# Patient Record
Sex: Male | Born: 1980 | Race: Black or African American | Hispanic: No | Marital: Single | Smoking: Current every day smoker
Health system: Southern US, Community
[De-identification: ages and names within clinical notes are randomized; demographics above are authoritative.]

---

## 2004-07-02 ENCOUNTER — Emergency Department (HOSPITAL_COMMUNITY): Admission: EM | Admit: 2004-07-02 | Discharge: 2004-07-02 | Payer: Self-pay | Admitting: Emergency Medicine

## 2004-12-26 ENCOUNTER — Emergency Department (HOSPITAL_COMMUNITY): Admission: EM | Admit: 2004-12-26 | Discharge: 2004-12-26 | Payer: Self-pay | Admitting: Emergency Medicine

## 2009-11-10 ENCOUNTER — Emergency Department (HOSPITAL_COMMUNITY): Admission: EM | Admit: 2009-11-10 | Discharge: 2009-11-10 | Payer: Self-pay | Admitting: Emergency Medicine

## 2010-11-19 LAB — RAPID STREP SCREEN (MED CTR MEBANE ONLY): Streptococcus, Group A Screen (Direct): NEGATIVE

## 2011-03-01 ENCOUNTER — Emergency Department (HOSPITAL_COMMUNITY)
Admission: EM | Admit: 2011-03-01 | Discharge: 2011-03-01 | Disposition: A | Discharge status: Home / Self Care | Payer: Self-pay | Attending: Emergency Medicine | Admitting: Emergency Medicine

## 2011-03-01 ENCOUNTER — Emergency Department (HOSPITAL_COMMUNITY): Payer: Self-pay

## 2011-03-01 DIAGNOSIS — K047 Periapical abscess without sinus: Secondary | ICD-10-CM | POA: Insufficient documentation

## 2013-10-16 ENCOUNTER — Emergency Department (HOSPITAL_COMMUNITY)
Admission: EM | Admit: 2013-10-16 | Discharge: 2013-10-16 | Disposition: A | Discharge status: Home / Self Care | Payer: BC Managed Care – PPO | Attending: Emergency Medicine | Admitting: Emergency Medicine

## 2013-10-16 ENCOUNTER — Encounter (HOSPITAL_COMMUNITY): Payer: Self-pay | Admitting: Emergency Medicine

## 2013-10-16 ENCOUNTER — Emergency Department (HOSPITAL_COMMUNITY): Payer: BC Managed Care – PPO

## 2013-10-16 DIAGNOSIS — K0889 Other specified disorders of teeth and supporting structures: Secondary | ICD-10-CM

## 2013-10-16 DIAGNOSIS — K089 Disorder of teeth and supporting structures, unspecified: Secondary | ICD-10-CM | POA: Insufficient documentation

## 2013-10-16 DIAGNOSIS — Z87891 Personal history of nicotine dependence: Secondary | ICD-10-CM | POA: Insufficient documentation

## 2013-10-16 DIAGNOSIS — K047 Periapical abscess without sinus: Secondary | ICD-10-CM

## 2013-10-16 LAB — BASIC METABOLIC PANEL
BUN: 10 mg/dL (ref 6–23)
CO2: 28 mEq/L (ref 19–32)
Calcium: 9.6 mg/dL (ref 8.4–10.5)
Chloride: 101 mEq/L (ref 96–112)
Creatinine, Ser: 0.87 mg/dL (ref 0.50–1.35)
GFR calc Af Amer: >90 mL/min (ref >90)
GFR calc non Af Amer: >90 mL/min (ref >90)
Glucose, Bld: 89 mg/dL (ref 70–99)
Potassium: 4.4 mEq/L (ref 3.7–5.3)
Sodium: 140 mEq/L (ref 137–147)

## 2013-10-16 MED ORDER — IOHEXOL 300 MG/ML  SOLN
75.0000 mL | Freq: Once | INTRAMUSCULAR | Status: AC | PRN
  Administered 2013-10-16: 75 mL via INTRAVENOUS

## 2013-10-16 MED ORDER — PENICILLIN V POTASSIUM 250 MG PO TABS
500.0000 mg | ORAL_TABLET | Freq: Once | ORAL | Status: AC
  Administered 2013-10-16: 500 mg via ORAL
  Filled 2013-10-16: qty 2

## 2013-10-16 MED ORDER — HYDROCODONE-ACETAMINOPHEN 5-325 MG PO TABS: 1.0000 | ORAL_TABLET | Freq: Four times a day (QID) | ORAL | Status: DC | PRN

## 2013-10-16 MED ORDER — PENICILLIN V POTASSIUM 500 MG PO TABS: 500.0000 mg | ORAL_TABLET | Freq: Four times a day (QID) | ORAL | Status: AC

## 2013-10-16 NOTE — ED Notes (Signed)
Pt c/o lower right side dental pain x 2 days. Mild right side jaw swelling noted. Nad. Wants antibiotic.

## 2013-10-16 NOTE — ED Provider Notes (Signed)
Medical screening examination/treatment/procedure(s) were performed by non-physician practitioner and as supervising physician I was immediately available for consultation/collaboration.  EKG Interpretation   None        Shon Batonourtney F Enma Maeda, MD 10/16/13 1850

## 2013-10-16 NOTE — ED Notes (Signed)
Swelling rt side of mandible , with dental pain.

## 2013-10-16 NOTE — ED Provider Notes (Signed)
CSN: [id number]     Arrival date & time 10/16/13  1233 History   First MD Initiated Contact with Patient 10/16/13 1252     Chief Complaint  Patient presents with  . Facial Swelling     (Consider location/radiation/quality/duration/timing/severity/associated sxs/prior Treatment) The history is provided by the patient. No language interpreter was used.  Aaron Waller is a 33 year old male with no known significant past medical history presenting to emergency department with dental pain has been ongoing for approximately one week. Patient reported that he cracked his tooth to the upper and lower jawline on the right side - patient is unable to recall exactly for how long. Reported that he's been experiencing pain to the right upper and lower jawline described as a throbbing sensation that is constant with radiation down to the right side of the neck. Stated that when he woke up this morning he noticed swelling to the right side of his face. Reported he has not been using anything for the pain. Stated that he's been experiencing right sided neck pain worse with motion and difficulty swallowing. Reported that he has not seen a dentist for a year. Denied numbness, tingling, ear pain, blurred vision, fever, chills, active drainage, chest pain, shortness of breath, difficulty breathing.  PCP none  History reviewed. No pertinent past medical history. History reviewed. No pertinent past surgical history. History reviewed. No pertinent family history. History  Substance Use Topics  . Smoking status: Former Games developermoker  . Smokeless tobacco: Not on file  . Alcohol Use: No    Review of Systems  Constitutional: Negative for fever and chills.  HENT: Positive for dental problem, facial swelling and trouble swallowing.   Respiratory: Negative for chest tightness and shortness of breath.   Cardiovascular: Negative for chest pain.  Gastrointestinal: Negative for nausea and vomiting.  Musculoskeletal:  Negative for neck pain.  Neurological: Negative for dizziness and weakness.  All other systems reviewed and are negative.      Allergies  Review of patient's allergies indicates no known allergies.  Home Medications   Current Outpatient Rx  Name  Route  Sig  Dispense  Refill  . HYDROcodone-acetaminophen (NORCO/VICODIN) 5-325 MG per tablet   Oral   Take 1 tablet by mouth every 6 (six) hours as needed.   7 tablet   0   . penicillin v potassium (VEETID) 500 MG tablet   Oral   Take 1 tablet (500 mg total) by mouth 4 (four) times daily.   40 tablet   0    BP 130/57  Pulse 55  Temp(Src) 98.2 F (36.8 C) (Oral)  Resp 17  SpO2 100% Physical Exam  Nursing note and vitals reviewed. Constitutional: He is oriented to person, place, and time. He appears well-developed and well-nourished. No distress.  HENT:  Head: Normocephalic and atraumatic.  Mouth/Throat:    Mild facial swelling localized to the right side of the face. Negative angioedema.  Poor dentition noted with numerous diagrammed teeth, process of decaying. Discomfort upon palpation to the right maxillary second premolar with decaying process-negative active drainage or bleeding noted. Negative trismus. Uvula midline, symmetrical elevation. Negative uvula swelling. Negative sublingual lesions.  Eyes: Conjunctivae and EOM are normal. Pupils are equal, round, and reactive to light. Right eye exhibits no discharge. Left eye exhibits no discharge.  Neck: Normal range of motion. No tracheal deviation present.    Discomfort upon palpation to the right aspect of the neck with mild swelling noted Negative cervical lymphadenopathy  Cardiovascular:  Normal rate, regular rhythm and normal heart sounds.   Pulses:      Radial pulses are 2+ on the right side, and 2+ on the left side.  Pulmonary/Chest: Effort normal and breath sounds normal. No respiratory distress. He has no wheezes. He has no rales.  Airway intact Negative use of  accessory muscles Patient is able to speak in full sentences without difficulty Negative stridor  Musculoskeletal: Normal range of motion.  Full ROM to upper and lower extremities without difficulty noted, negative ataxia noted.  Lymphadenopathy:    He has no cervical adenopathy.  Neurological: He is alert and oriented to person, place, and time. No cranial nerve deficit. He exhibits normal muscle tone. Coordination normal.  Cranial nerves III-XII grossly intact  Skin: Skin is warm and dry. He is not diaphoretic.    ED Course  Procedures (including critical care time)  Results for orders placed during the hospital encounter of 10/16/13  BASIC METABOLIC PANEL      Result Value Ref Range   Sodium 140  137 - 147 mEq/L   Potassium 4.4  3.7 - 5.3 mEq/L   Chloride 101  96 - 112 mEq/L   CO2 28  19 - 32 mEq/L   Glucose, Bld 89  70 - 99 mg/dL   BUN 10  6 - 23 mg/dL   Creatinine, Ser 1.61  0.50 - 1.35 mg/dL   Calcium 9.6  8.4 - 09.6 mg/dL   GFR calc non Af Amer >90  >90 mL/min   GFR calc Af Amer >90  >90 mL/min   Ct Soft Tissue Neck W Contrast  10/16/2013   CLINICAL DATA:  Right lower jaw wall and dental pain and swelling  EXAM: CT NECK WITH CONTRAST  TECHNIQUE: Multidetector CT imaging of the neck was performed using the standard protocol following the bolus administration of intravenous contrast.  CONTRAST:  75mL OMNIPAQUE IOHEXOL 300 MG/ML  SOLN  COMPARISON:  None.  FINDINGS: The mandible is adequately mineralized. No destructive bony lesions are demonstrated. No soft tissue gas is evident. Lower wisdom teeth bilaterally have erupted at an angle. The maxillary ridge appears intact. The temporomandibular joints are normal in appearance. The soft tissues surrounding the mandible appear normal with no soft tissue abscess demonstrated. The subcutaneous and deeper fat does not exhibit significant abnormality.  No bulky submandibular or anterior or posterior cervical lymph nodes are demonstrated.  The submandibular and parotid glands appear normal. The jugular and carotid vessels also appear normal. The nasal passages are patent. The paranasal sinuses exhibit no significant inflammatory changes. The tonsillar and adenoidal soft tissues appear normal. No laryngeal abnormality is demonstrated. The thyroid lobes are normal in appearance.  The cervical vertebral bodies are preserved in height.  IMPRESSION: 1. No soft tissue or bony abscess is demonstrated. 2. No submandibular lymphadenopathy is demonstrated. 3. No acute abnormality elsewhere within the neck is demonstrated.   Electronically Signed   By: David  [place]   On: 10/16/2013 16:29   Labs Review Labs Reviewed  BASIC METABOLIC PANEL   Imaging Review Ct Soft Tissue Neck W Contrast  10/16/2013   CLINICAL DATA:  Right lower jaw wall and dental pain and swelling  EXAM: CT NECK WITH CONTRAST  TECHNIQUE: Multidetector CT imaging of the neck was performed using the standard protocol following the bolus administration of intravenous contrast.  CONTRAST:  75mL OMNIPAQUE IOHEXOL 300 MG/ML  SOLN  COMPARISON:  None.  FINDINGS: The mandible is adequately mineralized. No destructive bony lesions  are demonstrated. No soft tissue gas is evident. Lower wisdom teeth bilaterally have erupted at an angle. The maxillary ridge appears intact. The temporomandibular joints are normal in appearance. The soft tissues surrounding the mandible appear normal with no soft tissue abscess demonstrated. The subcutaneous and deeper fat does not exhibit significant abnormality.  No bulky submandibular or anterior or posterior cervical lymph nodes are demonstrated. The submandibular and parotid glands appear normal. The jugular and carotid vessels also appear normal. The nasal passages are patent. The paranasal sinuses exhibit no significant inflammatory changes. The tonsillar and adenoidal soft tissues appear normal. No laryngeal abnormality is demonstrated. The thyroid lobes are  normal in appearance.  The cervical vertebral bodies are preserved in height.  IMPRESSION: 1. No soft tissue or bony abscess is demonstrated. 2. No submandibular lymphadenopathy is demonstrated. 3. No acute abnormality elsewhere within the neck is demonstrated.   Electronically Signed   By: David  [place]   On: 10/16/2013 16:29    EKG Interpretation   None       MDM   Final diagnoses:  Pain, dental  Periapical abscess   Medications  penicillin v potassium (VEETID) tablet 500 mg (not administered)  iohexol (OMNIPAQUE) 300 MG/ML solution 75 mL (75 mLs Intravenous Contrast Given 10/16/13 1604)   Filed Vitals:   10/16/13 1252  BP: 130/57  Pulse: 55  Temp: 98.2 F (36.8 C)  TempSrc: Oral  Resp: 17  SpO2: 100%    Patient presenting to emergency department with dental pain that has been ongoing for the past week. Reported that the pain is localized to the right lower and upper jawline described as a throbbing sensation with radiation down the right side of the neck. Reported neck pain localized to the right side with associated pain with motion and swallowing. Reported that he cracked his tooth, cannot recall when this occurred. Reported that he has not seen a dentist within a year. Reported that he has not been using any medications for the pain.  Alert and oriented. GCS 15. Heart rate and rhythm normal. Lungs clear to auscultation. Swelling localized to the right side of the face. Poor dentition noted with numerous missing teeth, diagrammed teeth, decaying teeth. Diagrammed decaying tooth localized to the right second premolar of the maxillary jawline with discomfort upon palpation with tongue depressor. Negative active bleeding, pus drainage noted. Negative trismus. Negative sublingual lesions. Uvula midline, symmetrical elevation. Discomfort upon palpation to the right musculature of the neck with decreased range of motion secondary to pain. BMP negative findings. CT soft tissue neck with  contrast negative for abscess. No submandibular lymphadenopathy noted-negative findings. Negative findings for peritonsillar abscess. Doubt Ludwig's angina. Suspicion to be beginnings of periapical abscess secondary to poor dentition and poor dental hygiene. Patient stable, afebrile. Dose of antibiotics given in the ED setting. Discharged patient with antibiotics and small dose of pain medications - discussed course, precautions, disposal technique. Referred patient to dentist. Discussed with patient to rest and stay hydrated. Discussed with patient to apply warm compressions. Discussed with patient to closely monitor symptoms and if symptoms are to worsen or change to report back to the ED - strict return instructions given.  Patient agreed to plan of care, understood, all questions answered.   Raymon Mutton, PA-C 10/16/13 1823

## 2013-10-16 NOTE — Discharge Instructions (Signed)
Please call and set-up an appointment with Dentist - will most likey need to have tooth removed Please take medications as prescribed - penicillin will prevent infection from spreading Please take pain medications as prescribed rash on pain medications his be absolutely no drinking alcohol, driving, operating any heavy machinery. If there is extra please disposer proper manner. Please do not take any extra Tylenol with this medication for this can lead to Tylenol overdose and liver issues. Please apply compressions to the rest of the face to aid in reduction swelling and massage Please rest and stay hydrated Please continue to monitor symptoms closely and if symptoms are to worsen or change (fever greater than 101, neck pain, neck stiffness, chest pain, shortness of breath, difficulty breathing, difficulty swallowing, worsening pain, numbness, tingling, changes to vision, blood or active drainage, muffled voice) please report back to emergency department immediately   Dental Abscess A dental abscess is a collection of infected fluid (pus) from a bacterial infection in the inner part of the tooth (pulp). It usually occurs at the end of the tooth's root.  CAUSES   Severe tooth decay.  Trauma to the tooth that allows bacteria to enter into the pulp, such as a broken or chipped tooth. SYMPTOMS   Severe pain in and around the infected tooth.  Swelling and redness around the abscessed tooth or in the mouth or face.  Tenderness.  Pus drainage.  Bad breath.  Bitter taste in the mouth.  Difficulty swallowing.  Difficulty opening the mouth.  Nausea.  Vomiting.  Chills.  Swollen neck glands. DIAGNOSIS   A medical and dental history will be taken.  An examination will be performed by tapping on the abscessed tooth.  X-rays may be taken of the tooth to identify the abscess. TREATMENT The goal of treatment is to eliminate the infection. You may be prescribed antibiotic medicine to  stop the infection from spreading. A root canal may be performed to save the tooth. If the tooth cannot be saved, it may be pulled (extracted) and the abscess may be drained.  HOME CARE INSTRUCTIONS  Only take over-the-counter or prescription medicines for pain, fever, or discomfort as directed by your caregiver.  Rinse your mouth (gargle) often with salt water ( tsp salt in 8 oz [250 ml] of warm water) to relieve pain or swelling.  Do not drive after taking pain medicine (narcotics).  Do not apply heat to the outside of your face.  Return to your dentist for further treatment as directed. SEEK MEDICAL CARE IF:  Your pain is not helped by medicine.  Your pain is getting worse instead of better. SEEK IMMEDIATE MEDICAL CARE IF:  You have a fever or persistent symptoms for more than 2 3 days.  You have a fever and your symptoms suddenly get worse.  You have chills or a very bad headache.  You have problems breathing or swallowing.  You have trouble opening your mouth.  You have swelling in the neck or around the eye. Document Released: 08/13/2005 Document Revised: 05/07/2012 Document Reviewed: 11/21/2010 [facility] Patient Information 2014 [place], [place].   Emergency Department Resource Guide 1) Find a Doctor and Pay Out of Pocket Although you won't have to find out who is covered by your insurance plan, it is a good idea to ask around and get recommendations. You will then need to call the office and see if the doctor you have chosen will accept you as a new patient and what types of options they  offer for patients who are self-pay. Some doctors offer discounts or will set up payment plans for their patients who do not have insurance, but you will need to ask so you aren't surprised when you get to your appointment.  2) Contact Your Local Health Department Not all health departments have doctors that can see patients for sick visits, but many do, so it is worth a call to see if  yours does. If you don't know where your local health department is, you can check in your phone book. The CDC also has a tool to help you locate your state's health department, and many state websites also have listings of all of their local health departments.  3) Find a Walk-in Clinic If your illness is not likely to be very severe or complicated, you may want to try a walk in clinic. These are popping up all over the country in pharmacies, drugstores, and shopping centers. They're usually staffed by nurse practitioners or physician assistants that have been trained to treat common illnesses and complaints. They're usually fairly quick and inexpensive. However, if you have serious medical issues or chronic medical problems, these are probably not your best option.  No Primary Care Doctor: - Call Health Connect at  [phone] - they can help you locate a primary care doctor that  accepts your insurance, provides certain services, etc. - Physician Referral Service- [phone]  Chronic Pain Problems: Organization         Address  Phone   Notes  [facility]  [phone] Patients need to be referred by their primary care doctor.   Medication Assistance: Organization         Address  Phone   Notes  [facility] Medication [facility] [address] [place] [place], [place] [address] [phone] --Must be a resident of [facility] -- Must have NO insurance coverage whatsoever (no Medicaid/ Medicare, etc.) -- The pt. MUST have a primary care doctor that directs their care regularly and follows them in the community   [facility]ist  [phone]   [employer]  [phone]    Agencies that provide inexpensive medical care: Organization         Address  Phone   Notes  [facility]  [phone]   [facility]    [phone]   [facility] [address] [place], [place] [address] [phone]    [facility] [address] [place], [place] [phone]   Planned Parenthood    [phone]   [facility]    [phone]   [facility] and [facility]  [address], [place] Phone:  [phone], Fax:  [phone] Hours of Operation:  9 am - 6 pm, M-F.  Also accepts Medicaid/Medicare and self-pay.  [facility]  [address], [place] Phone: [phone], Fax: [phone]. Hours of Operation:  8:30 am - 5:30 pm, M-F.  Also accepts Medicaid and self-pay.  [facility] [place] [address], [place] Phone: [phone]   [facility] [address] Natasha Bence [place], [place] [phone], Ext. [phone] Mondays & Thursdays: 7-9 AM.  First 15 patients are seen on a first come, first serve basis.    Medicaid-accepting [facility] Providers:  Organization         Address  Phone   Notes  [facility] [address], [place] [phone] Also accepts self-pay patients.  [facility] [address], [place]  [phone]   [facility] [address], [place] [phone]) [phone]   [facility] [address] [place] [address], [place] [phone]   Renaye RakersVeita Bland [address], [place]   [phone] Only accepts [place] Access [place] patients after they have their name applied to their card.   Self-Pay (no insurance) in [facility]:  Organization         Address  Phone   Notes  Sickle Cell Patients, [facility] [address], [place] [phone]) [phone]   [facility] [address], [place] [phone]   [facility] [place]  [address], [place] [phone]   [facility]/Dr. Osei-Bonsu  [address] [place] [address], [place] or [phone], [place] [phone]  [phone] Phone number for both [place] and [place] locations is the same.  [facility] [address], [place] [phone]   [facility] [place] [address] [place] [address], [place] or [address] [phone]) [phone]   [facility] [address], [place] [phone]) [phone], phone; [phone](336) [phone], fax Sees patients 1st and 3rd Saturday of every month.  Must not qualify for public or private insurance (i.e. Medicaid, Medicare, [place] Health Choice, Veterans' Benefits)  Household income should be no more than 200% of the poverty level The clinic cannot treat you if you are pregnant or think you are pregnant  Sexually transmitted diseases are not treated at the clinic.    Dental Care: Organization         Address  Phone  Notes  [facility] [address], [place] [phone]) [phone] Accepts children up to age 32 who are enrolled in [place]icaid or [place] Health Choice; pregnant women with a Medicaid card; and children who have applied for Medicaid or [place] Health Choice, but were declined, whose parents can pay a reduced fee at time of service.  [facility] [place]  [address], [place] [phone] Accepts children up to age 421 who are enrolled in [place] or [place] Health Choice; pregnant women with a Medicaid card; and children who have applied for Medicaid or [place] Health Choice, but were declined, whose parents can pay a reduced fee at time of service.  [facility]  [address], [place] [phone]) [phone] Patients are seen by appointment only. Walk-ins are not accepted. [facility] will see patients 32 years of age and older. Monday - Tuesday (8am-5pm) Most Wednesdays (8:30-5pm) $30 per visit, cash only  [facility]  [address], [place] [phone]) [phone] Patients are seen by  appointment only. Walk-ins are not accepted. [facility] will see patients 32 years of age and older. One Wednesday Evening (Monthly: Volunteer Based).  $30 per visit, cash only  [employer] [facility]  [phone] for adults; Children under age 534, call Graduate Pediatric Dentistry at [phone]. Children aged 34-14, please call [phone] to request a pediatric application.  Dental services are provided in all areas of dental care including fillings, crowns and bridges, complete and partial dentures, implants,  gum treatment, root canals, and extractions. Preventive care is also provided. Treatment is provided to both adults and children. Patients are selected via a lottery and there is often a waiting list.   [facility] [address], [place]  [phone] [email or web address]   [facility] [address] [place], [place] [phone], Ext. [phone] Second and Fourth Thursday of each month, opens at 6:30 AM; Clinic ends at 9 AM.  Patients are seen on a first-come first-served basis, and a limited number are seen during each clinic.   [facility]  [address] Hillard Danker [place], [place] [phone]   Eligibility Requirements You must have lived in [place], [place], or [place] counties for at least the last three months.   You cannot be eligible for state or federal sponsored [employer], including [employer], [place], or [employer].   You generally cannot be eligible for healthcare insurance through your employer.    How to apply: Eligibility screenings are held every Tuesday and Wednesday afternoon from 1:00 pm until 4:00 pm. You do not need an appointment for the interview!  [facility] [address] [place], [place]   [place] Department  [place]  [phone]     Behavioral Health Resources in the Community: Intensive Outpatient Programs Organization         Address  Phone  Notes  [facility] [place]. [address], [place], [place] [phone]   [facility] [address] [place], [place]   ADS: [facility] [address] [place], [place]   [place] [address],  [place], [place] or [phone]   Substance Abuse Resources Organization         Address  Phone  Notes  Alcohol and Drug Services  [phone]   [place]  [phone]   [place]   Chinita Pester  [phone]   Residential & Outpatient Substance Abuse Program  [phone]   Psychological Services Organization         Address  Phone  Notes  [facility] [place]  [place]  [phone]   [place] [address] [place] or [phone]    Mobile Crisis Teams Organization         Address  Phone  Notes  [facility], Mobile Crisis Care Unit  [phone]   [facility]  [address] [place], [place]   [place] [address], [place] [phone]    Self-Help/Support Groups Organization         Address  Phone             Notes  [place] of [place] - variety of support groups  [place] Call for more information  Narcotics Anonymous (NA), [facility] [address], [employer] [place]  2 meetings at this location   Special educational needs teacher         Address  Phone  Notes  [facility] [place],    [place]  [place]  [address] [place], [place], [place]   [place], [place] [phone] Admissions: 8am-3pm M-F  Incentives  Substance  [facility]use Treatment [facility] [address],    [place], [place] [phone]   [facility] [address] [place], [place], [place]   [facility] [address],  [place], [place]   [facility] - Intensive Outpatient [address], [place], [place]   [facility] [address].,  [place], [place] [phone] or [phone]   [facility] ([facility]S) [address], [place], [place] Accepts Medicaid  [place] [address]  [place] [phone] Substance Abuse/Addiction Treatment   [facility] Organization         Address  Phone  Notes  [facility]  [phone]   Domenic Schwab, PhD [address] [place], [place]   [phone] or [phone]   [place], [place] [phone]   [facility] [address], [place], [place] [phone] Insurance/Medicaid/sponsorship through [facility] and Families [address] [place] [place]                                    [place], [place] [phone] [place] [address], [place] [phone]    Dr. Adele Schilder  [phone]   [facility] Dept. 1) [address] [place] 2) [place] 3)  [place] [address], [place] [phone]  [phone]   [place] [phone] or [phone] (After Hours)

## 2015-03-25 ENCOUNTER — Emergency Department (HOSPITAL_COMMUNITY)
Admission: EM | Admit: 2015-03-25 | Discharge: 2015-03-25 | Disposition: A | Discharge status: Home / Self Care | Payer: Self-pay | Attending: Emergency Medicine | Admitting: Emergency Medicine

## 2015-03-25 ENCOUNTER — Encounter (HOSPITAL_COMMUNITY): Payer: Self-pay | Admitting: Emergency Medicine

## 2015-03-25 DIAGNOSIS — Y9389 Activity, other specified: Secondary | ICD-10-CM | POA: Insufficient documentation

## 2015-03-25 DIAGNOSIS — W57XXXA Bitten or stung by nonvenomous insect and other nonvenomous arthropods, initial encounter: Secondary | ICD-10-CM | POA: Insufficient documentation

## 2015-03-25 DIAGNOSIS — Y9289 Other specified places as the place of occurrence of the external cause: Secondary | ICD-10-CM | POA: Insufficient documentation

## 2015-03-25 DIAGNOSIS — S50861A Insect bite (nonvenomous) of right forearm, initial encounter: Secondary | ICD-10-CM | POA: Insufficient documentation

## 2015-03-25 DIAGNOSIS — Z720 Tobacco use: Secondary | ICD-10-CM | POA: Insufficient documentation

## 2015-03-25 DIAGNOSIS — Y998 Other external cause status: Secondary | ICD-10-CM | POA: Insufficient documentation

## 2015-03-25 MED ORDER — PREDNISONE 10 MG PO TABS
ORAL_TABLET | ORAL | Status: DC
Start: 2015-03-25 — End: 2016-08-30

## 2015-03-25 MED ORDER — PREDNISONE 50 MG PO TABS
60.0000 mg | ORAL_TABLET | Freq: Once | ORAL | Status: AC
  Administered 2015-03-25: 60 mg via ORAL
  Filled 2015-03-25 (×2): qty 1

## 2015-03-25 MED ORDER — LORATADINE 10 MG PO TABS
10.0000 mg | ORAL_TABLET | Freq: Once | ORAL | Status: AC
  Administered 2015-03-25: 10 mg via ORAL
  Filled 2015-03-25: qty 1

## 2015-03-25 NOTE — ED Notes (Signed)
PT stated though insect bite to right anterior forearm last night with swelling and redness noted to area today.

## 2015-03-25 NOTE — Discharge Instructions (Signed)
Please use Claritin each morning, Benadryl in the evening for itching and burning. Please use prednisone taper as prescribed. Please see your primary physician or return to the burn's department if any problems or concerns.

## 2015-03-25 NOTE — ED Provider Notes (Signed)
CSN: [id number]     Arrival date & time 03/25/15  1849 History   First MD Initiated Contact with Patient 03/25/15 1900     Chief Complaint  Patient presents with  . Insect Bite     (Consider location/radiation/quality/duration/timing/severity/associated sxs/prior Treatment) HPI Comments: Patient is a 34 year old [place] who presents to the emergency department with complaint of insect bite. The patient states on last evening he sustained bites on his right arm. Today he noted swelling and redness of the area, with some pain and discomfort. He feels as though the swelling and redness has increased and he became concerned and came to the emergency department. His been no complaint of shortness of breath, difficulty breathing, swelling of the mouth or throat or face. The patient has no known history of allergy to insect bites. He has not taken any medication for this event.  The history is provided by the patient.    History reviewed. No pertinent past medical history. History reviewed. No pertinent past surgical history. History reviewed. No pertinent family history. History  Substance Use Topics  . Smoking status: Current Every Day Smoker -- 0.50 packs/day    Types: Cigarettes  . Smokeless tobacco: Not on file  . Alcohol Use: No    Review of Systems  Constitutional: Negative for activity change.       All ROS Neg except as noted in HPI  HENT: Negative for nosebleeds.   Eyes: Negative for photophobia and discharge.  Respiratory: Negative for cough, shortness of breath and wheezing.   Cardiovascular: Negative for chest pain and palpitations.  Gastrointestinal: Negative for abdominal pain and blood in stool.  Genitourinary: Negative for dysuria, frequency and hematuria.  Musculoskeletal: Negative for back pain, arthralgias and neck pain.  Skin: Negative.   Neurological: Negative for dizziness, seizures and speech difficulty.  Psychiatric/Behavioral: Negative for hallucinations and  confusion.      Allergies  Review of patient's allergies indicates no known allergies.  Home Medications   Prior to Admission medications   Medication Sig Start Date End Date Taking? Authorizing Provider  HYDROcodone-acetaminophen (NORCO/VICODIN) 5-325 MG per tablet Take 1 tablet by mouth every 6 (six) hours as needed. 10/16/13   Marissa Sciacca, PA-C   BP 141/81 mmHg  Pulse 68  Temp(Src) 98.7 F (37.1 C) (Oral)  Resp 16  Ht  (1.676 m)  Wt 185 lb (83.915 kg)  BMI 29.87 kg/m2  SpO2 100% Physical Exam  Constitutional: He is oriented to person, place, and time. He appears well-developed and well-nourished.  Non-toxic appearance.  HENT:  Head: Normocephalic.  Right Ear: Tympanic membrane and external ear normal.  Left Ear: Tympanic membrane and external ear normal.  Airway is patent. There is no facial swelling appreciated.  Eyes: EOM and lids are normal. Pupils are equal, round, and reactive to light.  Neck: Normal range of motion. Neck supple. Carotid bruit is not present.  Cardiovascular: Normal rate, regular rhythm, normal heart sounds, intact distal pulses and normal pulses.   Pulmonary/Chest: Breath sounds normal. No respiratory distress. He has no decreased breath sounds. He has no wheezes. He has no rhonchi. He has no rales.  Abdominal: Soft. Bowel sounds are normal. There is no tenderness. There is no guarding.  Musculoskeletal: Normal range of motion.  Red raised area of the mid forearm and also the lateral bicep tricep area. There is no drainage from these areas. There is no red streaks appreciated. The area is warm, but not hot.  There is full range  of motion of the right shoulder, elbow, wrist, fingers. Capillary refill is less than 2 seconds.  Lymphadenopathy:       Head (right side): No submandibular adenopathy present.       Head (left side): No submandibular adenopathy present.    He has no cervical adenopathy.  Neurological: He is alert and oriented to  person, place, and time. He has normal strength. No cranial nerve deficit or sensory deficit.  Skin: Skin is warm and dry.  Psychiatric: He has a normal mood and affect. His speech is normal.  Nursing note and vitals reviewed.   ED Course  Procedures (including critical care time) Labs Review Labs Reviewed - No data to display  Imaging Review No results found.   EKG Interpretation None      MDM  Examination favors local reaction to insect bite/sting. Patient will be asked to use cool compresses, as well as Claritin during the morning hours, and a drill at bedtime if needed for itching or burning. Prescription for prednisone taper also given. The patient will return to the emergency department if any changes or problems.    Final diagnoses:  None    **I have reviewed nursing notes, vital signs, and all appropriate lab and imaging results for this patient.Ivery Quale, PA-C 03/25/15 1957  Glynn Octave, MD 03/25/15 [phone]

## 2015-05-12 IMAGING — CT CT NECK W/ CM
3 of 4 series · 14 of 33 positions shown, 17 images · IV contrast (Omnipaque 300)
Comparison: None.

CLINICAL DATA: Right lower jaw wall and dental pain and swelling

EXAM:
CT NECK WITH CONTRAST
TECHNIQUE: Multidetector CT imaging of the neck was performed using the
standard protocol following the bolus administration of intravenous
contrast.
CONTRAST:  75mL OMNIPAQUE IOHEXOL 300 MG/ML  SOLN

[Series 4: neck 2.0 soft tissue sag · sagittal · 0.44mm/px · 5 of 142 slices shown, 6 images]
[im 48/142  bone]
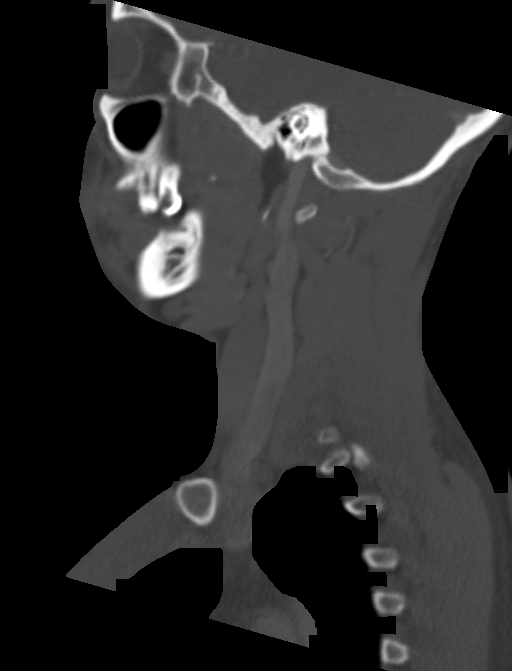
[im 59/142  bone]
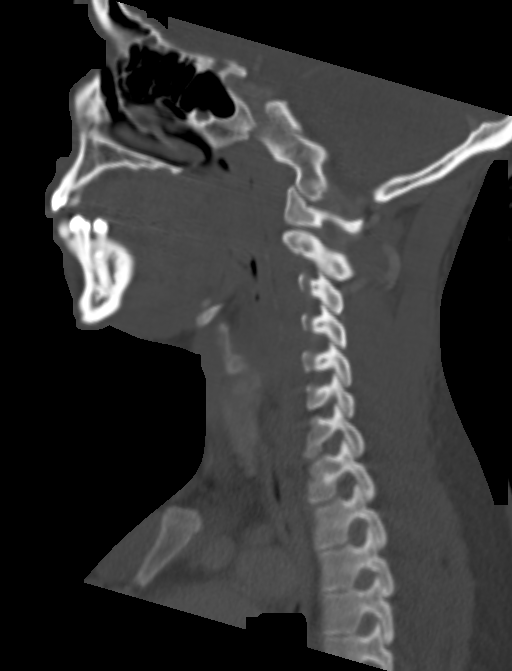
[im 71/142  soft-tissue]
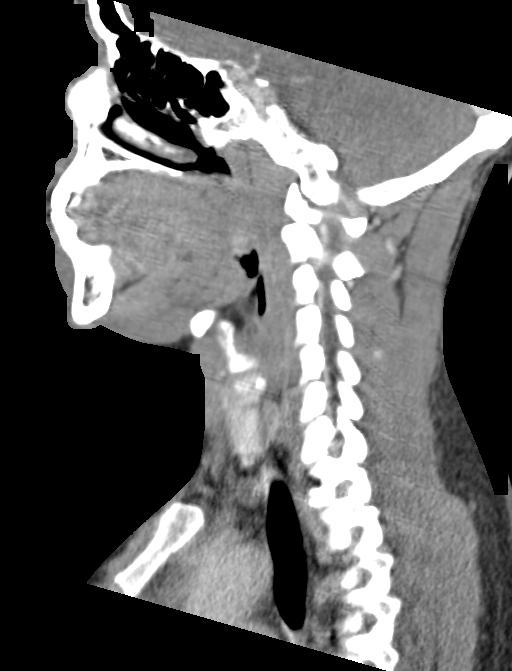
[im 71/142  bone]
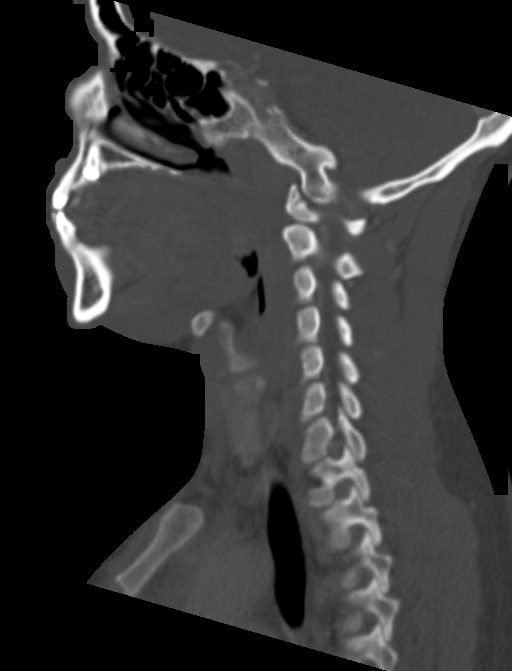
[im 83/142  bone]
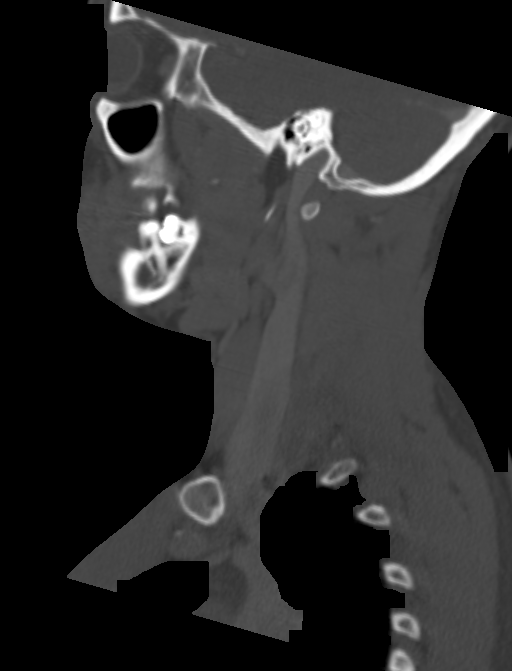
[im 95/142  bone]
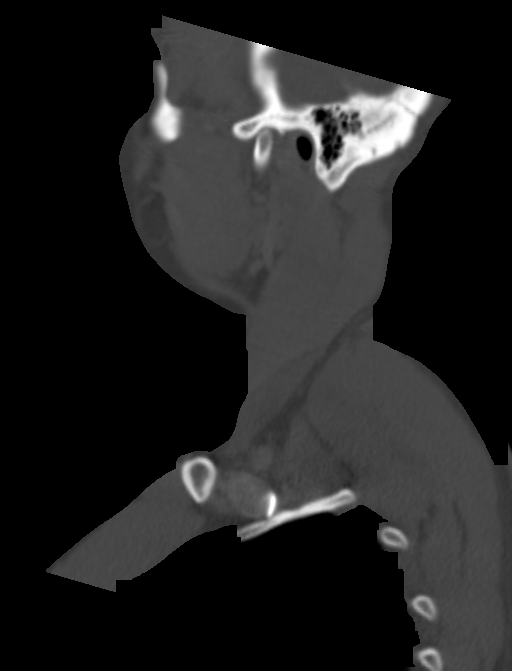

[Series 5: neck 2.0 soft tissue coro · coronal · 0.58mm/px · 3 of 111 slices shown]
[im 36/111  bone]
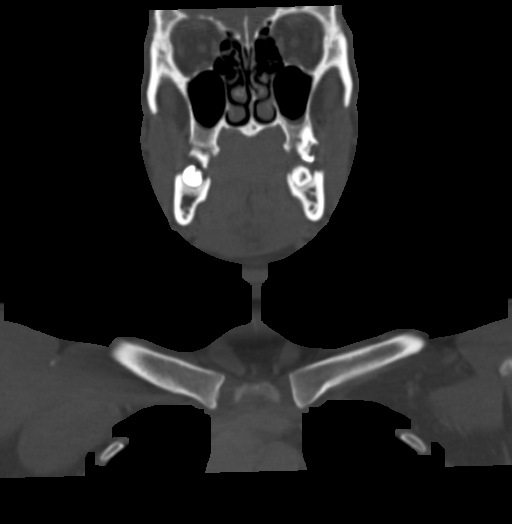
[im 49/111  bone]
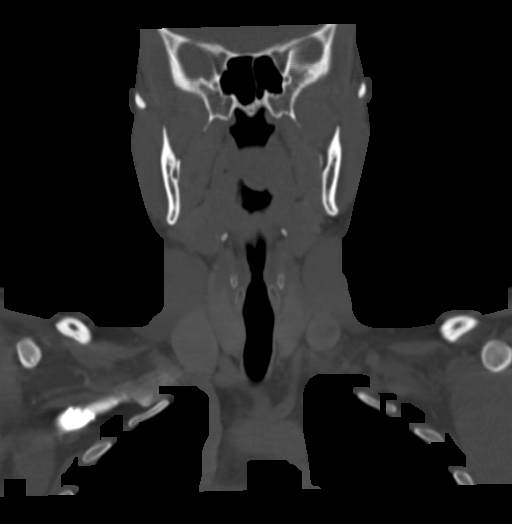
[im 62/111  bone]
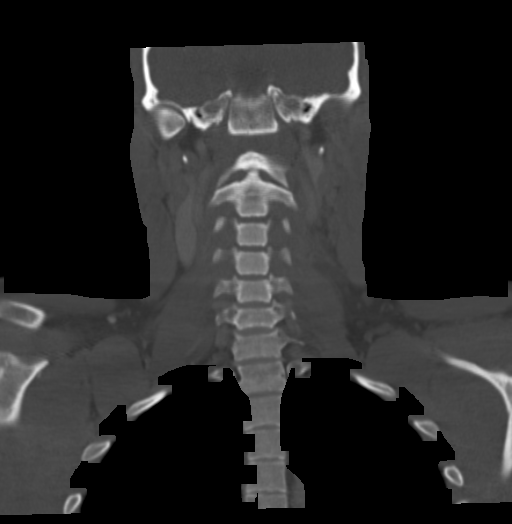

[Series 6: axial soft tissue neck 2.0 · axial · 0.53mm/px · z∈[+16,+218]mm · 6 of 147 slices shown, 8 images]
[im 21/147  soft-tissue]
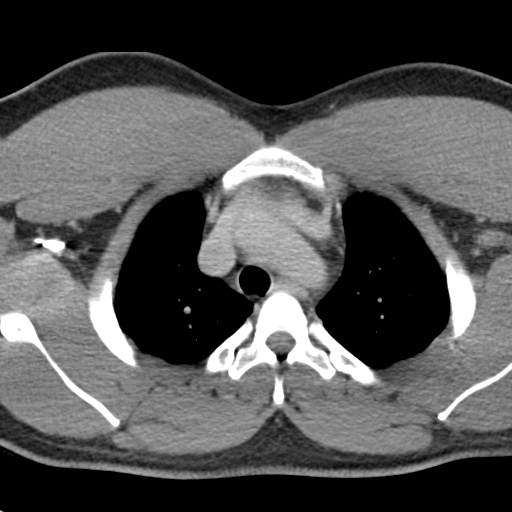
[im 21/147  bone]
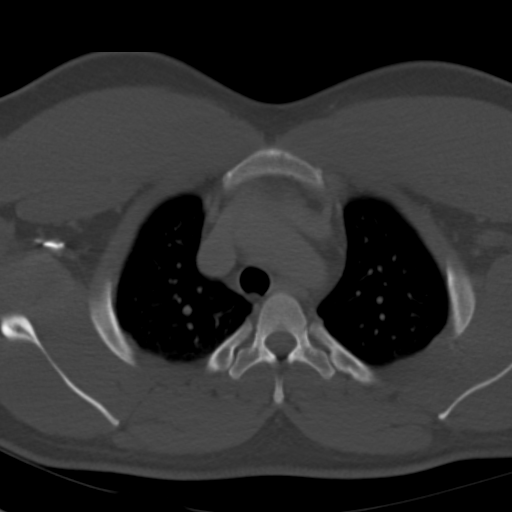
[im 42/147  bone]
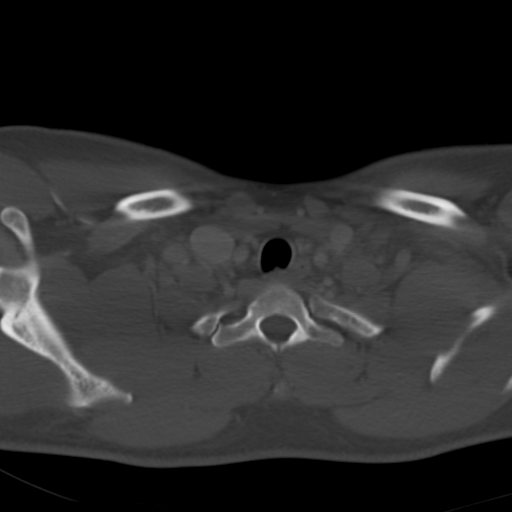
[im 63/147  bone]
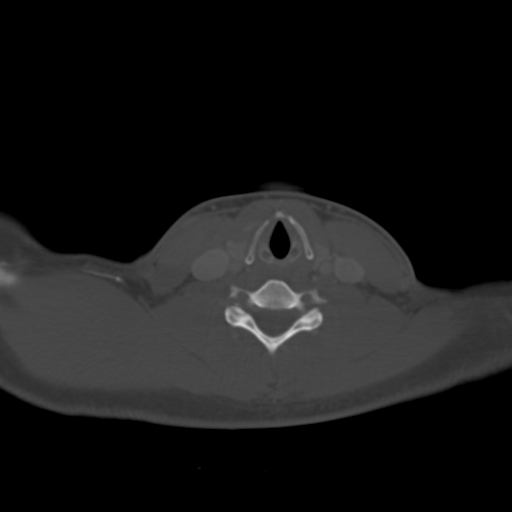
[im 84/147  bone]
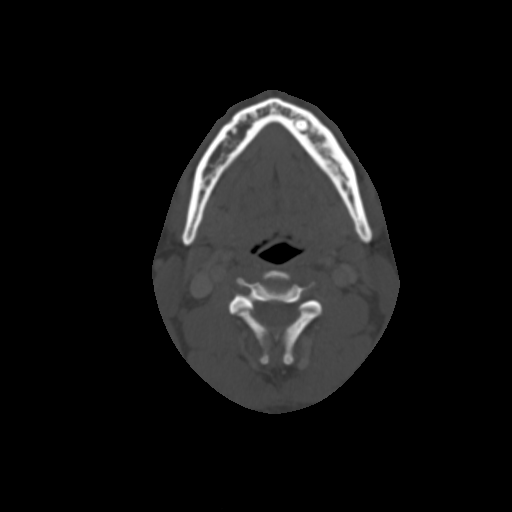
[im 105/147  soft-tissue]
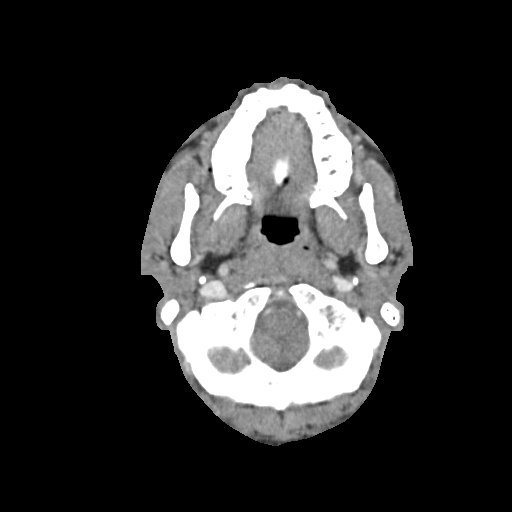
[im 105/147  bone]
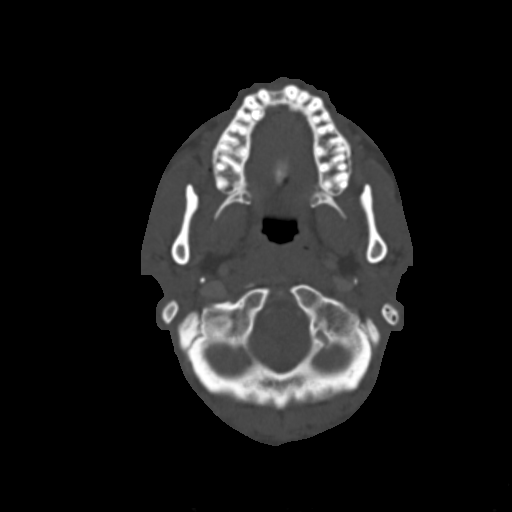
[im 126/147  bone]
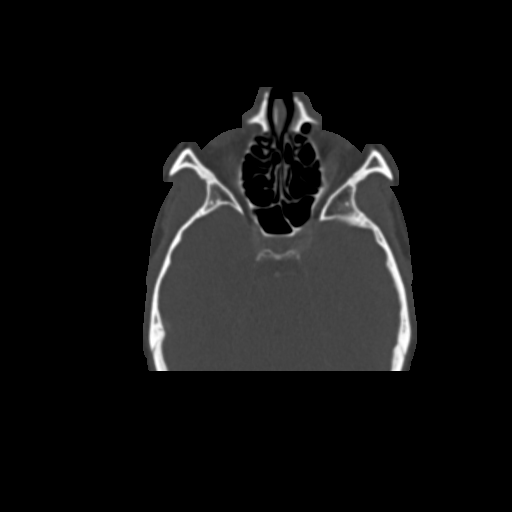

[14 of 33 positions shown; findings below may reference images not displayed]

FINDINGS: The mandible is adequately mineralized. No destructive bony lesions
are demonstrated. No soft tissue gas is evident. Lower wisdom teeth
bilaterally have erupted at an angle. The maxillary ridge appears
intact. The temporomandibular joints are normal in appearance. The
soft tissues surrounding the mandible appear normal with no soft
tissue abscess demonstrated. The subcutaneous and deeper fat does
not exhibit significant abnormality.

No bulky submandibular or anterior or posterior cervical lymph nodes
are demonstrated. The submandibular and parotid glands appear
normal. The jugular and carotid vessels also appear normal. The
nasal passages are patent. The paranasal sinuses exhibit no
significant inflammatory changes. The tonsillar and adenoidal soft
tissues appear normal. No laryngeal abnormality is demonstrated. The
thyroid lobes are normal in appearance.

The cervical vertebral bodies are preserved in height.
IMPRESSION: 1. No soft tissue or bony abscess is demonstrated.
2. No submandibular lymphadenopathy is demonstrated.
3. No acute abnormality elsewhere within the neck is demonstrated.

## 2016-08-30 ENCOUNTER — Emergency Department (HOSPITAL_COMMUNITY)
Admission: EM | Admit: 2016-08-30 | Discharge: 2016-08-30 | Disposition: A | Discharge status: Home / Self Care | Payer: Self-pay | Attending: Emergency Medicine | Admitting: Emergency Medicine

## 2016-08-30 ENCOUNTER — Encounter (HOSPITAL_COMMUNITY): Payer: Self-pay | Admitting: Emergency Medicine

## 2016-08-30 DIAGNOSIS — J705 Respiratory conditions due to smoke inhalation: Secondary | ICD-10-CM | POA: Insufficient documentation

## 2016-08-30 DIAGNOSIS — F17210 Nicotine dependence, cigarettes, uncomplicated: Secondary | ICD-10-CM | POA: Insufficient documentation

## 2016-08-30 DIAGNOSIS — T59811A Toxic effect of smoke, accidental (unintentional), initial encounter: Secondary | ICD-10-CM

## 2016-08-30 LAB — CBC
HEMATOCRIT: 40.5 % (ref 39.0–52.0)
Hemoglobin: 13.9 g/dL (ref 13.0–17.0)
MCH: 30.5 pg (ref 26.0–34.0)
MCHC: 34.3 g/dL (ref 30.0–36.0)
MCV: 89 fL (ref 78.0–100.0)
Platelets: 164 10*3/uL (ref 150–400)
RBC: 4.55 MIL/uL (ref 4.22–5.81)
RDW: 12.1 % (ref 11.5–15.5)
WBC: 5.6 10*3/uL (ref 4.0–10.5)

## 2016-08-30 LAB — BASIC METABOLIC PANEL
Anion gap: 5 (ref 5–15)
BUN: 12 mg/dL (ref 6–20)
CO2: 29 mmol/L (ref 22–32)
Calcium: 9.5 mg/dL (ref 8.9–10.3)
Chloride: 104 mmol/L (ref 101–111)
Creatinine, Ser: 0.99 mg/dL (ref 0.61–1.24)
GFR calc non Af Amer: >60 mL/min (ref >60)
Glucose, Bld: 97 mg/dL (ref 65–99)
Potassium: 4.5 mmol/L (ref 3.5–5.1)
SODIUM: 138 mmol/L (ref 135–145)

## 2016-08-30 LAB — CARBOXYHEMOGLOBIN - COOX: Carboxyhemoglobin: 2.6 % — ABNORMAL HIGH (ref 0.5–1.5)

## 2016-08-30 MED ORDER — IPRATROPIUM-ALBUTEROL 0.5-2.5 (3) MG/3ML IN SOLN
3.0000 mL | Freq: Once | RESPIRATORY_TRACT | Status: AC
  Administered 2016-08-30: 3 mL via RESPIRATORY_TRACT
  Filled 2016-08-30: qty 3
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
## 2016-08-30 NOTE — ED Notes (Signed)
CRITICAL VALUE ALERT  Critical value received:  Carboxyhemoblobin -2.6  Date of notification:  08/30/2016  Time of notification:  1047  Critical value read back: yes  Nurse who received alert:  LJS  MD notified (1st page):  Ivar Drapeob Browning PA  Time of first page:  1045  MD notified (2nd page):  Time of second page:  Responding MD:  Ivar Drapeob Browning PA  Time MD responded:  1045
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
## 2016-08-30 NOTE — ED Notes (Signed)
Patient placed on 15 L via non-rebreather by respiratory as ordered by PA.
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
## 2019-02-08 ENCOUNTER — Emergency Department (HOSPITAL_COMMUNITY): Payer: Self-pay

## 2019-02-08 ENCOUNTER — Other Ambulatory Visit: Payer: Self-pay

## 2019-02-08 ENCOUNTER — Encounter (HOSPITAL_COMMUNITY): Payer: Self-pay | Admitting: Emergency Medicine

## 2019-02-08 ENCOUNTER — Emergency Department (HOSPITAL_COMMUNITY)
Admission: EM | Admit: 2019-02-08 | Discharge: 2019-02-08 | Disposition: A | Discharge status: Home / Self Care | Payer: Self-pay | Attending: Emergency Medicine | Admitting: Emergency Medicine

## 2019-02-08 DIAGNOSIS — S39012A Strain of muscle, fascia and tendon of lower back, initial encounter: Secondary | ICD-10-CM | POA: Insufficient documentation

## 2019-02-08 DIAGNOSIS — F17210 Nicotine dependence, cigarettes, uncomplicated: Secondary | ICD-10-CM | POA: Insufficient documentation

## 2019-02-08 DIAGNOSIS — Y9389 Activity, other specified: Secondary | ICD-10-CM | POA: Insufficient documentation

## 2019-02-08 DIAGNOSIS — Y999 Unspecified external cause status: Secondary | ICD-10-CM | POA: Insufficient documentation

## 2019-02-08 DIAGNOSIS — Y92008 Other place in unspecified non-institutional (private) residence as the place of occurrence of the external cause: Secondary | ICD-10-CM | POA: Insufficient documentation

## 2019-02-08 DIAGNOSIS — X501XXA Overexertion from prolonged static or awkward postures, initial encounter: Secondary | ICD-10-CM | POA: Insufficient documentation

## 2019-02-08 LAB — URINALYSIS, ROUTINE W REFLEX MICROSCOPIC
Bilirubin Urine: NEGATIVE
Glucose, UA: NEGATIVE mg/dL
Hgb urine dipstick: NEGATIVE
Ketones, ur: 5 mg/dL — AB
Leukocytes,Ua: NEGATIVE
Nitrite: NEGATIVE
Protein, ur: NEGATIVE mg/dL
Specific Gravity, Urine: 1.016 (ref 1.005–1.030)
pH: 6 (ref 5.0–8.0)

## 2019-02-08 MED ORDER — CYCLOBENZAPRINE HCL 10 MG PO TABS
10.0000 mg | ORAL_TABLET | Freq: Once | ORAL | Status: AC
  Administered 2019-02-08: 10 mg via ORAL
  Filled 2019-02-08: qty 1

## 2019-02-08 MED ORDER — KETOROLAC TROMETHAMINE 30 MG/ML IJ SOLN
30.0000 mg | Freq: Once | INTRAMUSCULAR | Status: AC
  Administered 2019-02-08: 30 mg via INTRAMUSCULAR
  Filled 2019-02-08: qty 1

## 2019-02-08 MED ORDER — CYCLOBENZAPRINE HCL 5 MG PO TABS: 5.0000 mg | ORAL_TABLET | Freq: Three times a day (TID) | ORAL | 0 refills | Status: DC | PRN

## 2019-02-08 MED ORDER — IBUPROFEN 600 MG PO TABS: 600.0000 mg | ORAL_TABLET | Freq: Four times a day (QID) | ORAL | 0 refills | Status: DC | PRN
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
## 2019-02-08 NOTE — ED Triage Notes (Signed)
Patient c/o lower back pain x2-3 days. Denies any known injuries. Patient reports nausea and vomiting. Denies any complications with BMs or urination. Per patient pain runs into buttocks bilaterally.

## 2019-02-08 NOTE — Discharge Instructions (Signed)
Take the medicines as prescribed.   Avoid lifting,  Bending,  Twisting or any other activity that worsens your pain over the next week.  Apply a heating pad or sit in a hot tub of water for 20 minutes 3 times daily.  You should get rechecked if your symptoms are not improving over the next week with this treatment or  you develop increased pain,  weakness in your leg(s) or loss of bladder or bowel function as you would require further tests with any of these symptoms.

## 2021-01-12 ENCOUNTER — Emergency Department (HOSPITAL_COMMUNITY)
Admission: EM | Admit: 2021-01-12 | Discharge: 2021-01-12 | Disposition: A | Discharge status: Home / Self Care | Payer: Self-pay | Attending: Emergency Medicine | Admitting: Emergency Medicine

## 2021-01-12 ENCOUNTER — Other Ambulatory Visit: Payer: Self-pay

## 2021-01-12 ENCOUNTER — Encounter (HOSPITAL_COMMUNITY): Payer: Self-pay | Admitting: *Deleted

## 2021-01-12 DIAGNOSIS — Z113 Encounter for screening for infections with a predominantly sexual mode of transmission: Secondary | ICD-10-CM | POA: Insufficient documentation

## 2021-01-12 DIAGNOSIS — F17210 Nicotine dependence, cigarettes, uncomplicated: Secondary | ICD-10-CM | POA: Insufficient documentation

## 2021-01-12 LAB — URINALYSIS, ROUTINE W REFLEX MICROSCOPIC
Bacteria, UA: NONE SEEN
Bilirubin Urine: NEGATIVE
Glucose, UA: NEGATIVE mg/dL
Hgb urine dipstick: NEGATIVE
Ketones, ur: NEGATIVE mg/dL
Nitrite: NEGATIVE
Protein, ur: NEGATIVE mg/dL
Specific Gravity, Urine: 1.017 (ref 1.005–1.030)
pH: 7 (ref 5.0–8.0)

## 2021-01-12 NOTE — ED Triage Notes (Signed)
Pt requesting STD check since a partner informed him of having an infection.  Pt denies any symptoms of discharge.

## 2021-01-12 NOTE — Discharge Instructions (Addendum)
You have been tested today for a sexually transmitted disease.  You will be notified of any positive results.  You may also review the results on the MyChart app.  You may follow-up with the health department if needed.  Return to the emergency department for any new or worsening symptoms.

## 2021-01-12 NOTE — ED Notes (Signed)
Pt left prior to e-signing. Pt denied any questions at discharge. nad noted.

## 2021-01-12 NOTE — ED Provider Notes (Signed)
[facility] EMERGENCY DEPARTMENT Provider Note   CSN: [id number] Arrival date & time: 01/12/21  1337     History Chief Complaint  Patient presents with  . Exposure to STD    [facility] is a 40 y.o. male.  HPI     [facility]er is a 40 y.o. male who presents to the Emergency Department requesting evaluation for possible STI.  States that he was notified earlier today by his significant other that she is currently being treated for a vaginal infection.  Last sexual intercourse was 3 days ago.  Patient denies any symptoms including abdominal pain, dysuria, fever, chills, genital lesions or penile discharge.  No pain or swelling of the testicles or scrotum.  He is here requesting testing.   History reviewed. No pertinent past medical history.  There are no problems to display for this patient.   History reviewed. No pertinent surgical history.     History reviewed. No pertinent family history.  Social History   Tobacco Use  . Smoking status: Current Every Day Smoker    Packs/day: 0.50    Types: Cigarettes  . Smokeless tobacco: Never Used  Vaping Use  . Vaping Use: Never used  Substance Use Topics  . Alcohol use: No  . Drug use: No    Home Medications Prior to Admission medications   Medication Sig Start Date End Date Taking? Authorizing Provider  cyclobenzaprine (FLEXERIL) 5 MG tablet Take 1 tablet (5 mg total) by mouth 3 (three) times daily as needed for muscle spasms. 02/08/19   Idol, Raynelle Fanning, PA-C  ibuprofen (ADVIL) 600 MG tablet Take 1 tablet (600 mg total) by mouth every 6 (six) hours as needed. 02/08/19   Burgess Amor, PA-C    Allergies    Tomato  Review of Systems   Review of Systems  Constitutional: Negative for chills, fatigue and fever.  Respiratory: Negative for shortness of breath.   Cardiovascular: Negative for chest pain.  Gastrointestinal: Negative for abdominal pain, diarrhea, nausea and vomiting.  Genitourinary: Negative for  dysuria, flank pain, genital sores, hematuria, penile discharge, penile swelling, scrotal swelling, testicular pain and urgency.  Musculoskeletal: Negative for arthralgias, back pain and myalgias.  Skin: Negative for rash.  Neurological: Negative for dizziness, weakness and numbness.  Hematological: Does not bruise/bleed easily.    Physical Exam Updated Vital Signs BP 137/82 (BP Location: Right Arm)   Pulse (!) 55   Temp 98.3 F (36.8 C) (Oral)   Resp 14   Ht 5\' 8"  (1.727 m)   Wt 93.4 kg   SpO2 100%   BMI 31.32 kg/m   Physical Exam Vitals and nursing note reviewed.  Constitutional:      General: He is not in acute distress.    Appearance: Normal appearance. He is not ill-appearing.  Cardiovascular:     Rate and Rhythm: Normal rate and regular rhythm.     Pulses: Normal pulses.  Pulmonary:     Effort: Pulmonary effort is normal.     Breath sounds: Normal breath sounds.  Abdominal:     Palpations: Abdomen is soft.     Tenderness: There is no abdominal tenderness. There is no right CVA tenderness or left CVA tenderness.  Musculoskeletal:        General: Normal range of motion.  Skin:    General: Skin is warm.     Capillary Refill: Capillary refill takes less than 2 seconds.     Findings: No rash.  Neurological:  General: No focal deficit present.     Mental Status: He is alert.     ED Results / Procedures / Treatments   Labs (all labs ordered are listed, but only abnormal results are displayed) Labs Reviewed  URINALYSIS, ROUTINE W REFLEX MICROSCOPIC  GC/CHLAMYDIA PROBE AMP ([place]) NOT AT [facility]    EKG None  Radiology No results found.  Procedures Procedures   Medications Ordered in ED Medications - No data to display  ED Course  I have reviewed the triage vital signs and the nursing notes.  Pertinent labs & imaging results that were available during my care of the patient were reviewed by me and considered in my medical decision making (see  chart for details).    MDM Rules/Calculators/A&P                          Asymptomatic patient here requesting evaluation of STI.  States sexual partner was recently diagnosed with a "vaginal infection."  He believes she has bacterial vaginosis.  He is denying any symptoms at this time. GC and Chlamydia test pending along with urinalysis.  Patient agreeable to review results on MyChart.  Also advised him that he will be notified if any results are positive.  Patient declined offer for blood work to check for HIV and syphilis stating that he has recently been tested.   Final Clinical Impression(s) / ED Diagnoses Final diagnoses:  Screen for STD (sexually transmitted disease)    Rx / DC Orders ED Discharge Orders    None       Pauline Aus, PA-C 01/12/21 1550    Vanetta Mulders, MD 01/13/21 1810

## 2021-01-16 LAB — GC/CHLAMYDIA PROBE AMP (~~LOC~~) NOT AT ARMC
Chlamydia: NEGATIVE
Comment: NEGATIVE
Comment: NORMAL
Neisseria Gonorrhea: NEGATIVE

## 2021-01-30 ENCOUNTER — Ambulatory Visit: Payer: Self-pay | Admitting: Internal Medicine

## 2021-02-09 ENCOUNTER — Other Ambulatory Visit: Payer: Self-pay

## 2021-02-09 ENCOUNTER — Ambulatory Visit (INDEPENDENT_AMBULATORY_CARE_PROVIDER_SITE_OTHER): Payer: Self-pay | Admitting: Internal Medicine

## 2021-02-09 ENCOUNTER — Encounter: Payer: Self-pay | Admitting: Internal Medicine

## 2021-02-09 VITALS — BP 132/71 | HR 54 | Temp 98.4°F | Resp 18 | Ht 68.0 in | Wt 205.4 lb

## 2021-02-09 DIAGNOSIS — Z7689 Persons encountering health services in other specified circumstances: Secondary | ICD-10-CM | POA: Insufficient documentation

## 2021-02-09 DIAGNOSIS — Z9189 Other specified personal risk factors, not elsewhere classified: Secondary | ICD-10-CM

## 2021-02-09 DIAGNOSIS — Z1159 Encounter for screening for other viral diseases: Secondary | ICD-10-CM

## 2021-02-09 DIAGNOSIS — Z720 Tobacco use: Secondary | ICD-10-CM | POA: Insufficient documentation

## 2021-02-09 DIAGNOSIS — Z113 Encounter for screening for infections with a predominantly sexual mode of transmission: Secondary | ICD-10-CM

## 2021-02-09 MED ORDER — METRONIDAZOLE 500 MG PO TABS: 2000.0000 mg | ORAL_TABLET | Freq: Once | ORAL | 0 refills | Status: AC

## 2021-02-09 NOTE — Patient Instructions (Signed)
Please take Metronidazole as prescribed.  Please keep the genital area clean and dry. If you notice any discharge or rash in the genital area, contact us.  Please try to quit smoking soon.

## 2021-02-09 NOTE — Assessment & Plan Note (Signed)
Care established Previous chart reviewed History and medications reviewed with the patient 

## 2021-02-09 NOTE — Assessment & Plan Note (Signed)
Smokes about 0.5 cigarettes/day - not significant  Asked about quitting: confirms that he currently smokes cigarettes Advise to quit smoking: Educated about QUITTING to reduce the risk of cancer, cardio and cerebrovascular disease. Assess willingness: willing to quit at this time Assist with counseling and pharmacotherapy: Counseled for 5 minutes and literature provided. Arrange for follow up: follow up and continue to offer help.

## 2021-02-09 NOTE — Assessment & Plan Note (Signed)
Recent ER visit for STD screen Will empirically give Metronidazole 2 gm one time dose ER chart reviewed - negative Neisseria and Chlamydia Check STD screen and Hep C

## 2021-02-09 NOTE — Progress Notes (Signed)
New Patient Office Visit  Subjective:  Patient ID: Aaron Waller, male    DOB: Mar 04, 1981  Age: 40 y.o. MRN: [id number]  CC:  Chief Complaint  Patient presents with   New Patient (Initial Visit)    New patient has tingling sensation when urinates was also in the hospital last month     HPI Aaron Waller is a 40 year old male with no significant PMH who presents for establishing care.  He recently went to ER for STD screen as his partner is being treated for vaginitis and told him to get evaluated. His Neisseria and Chlamydia screen were negative. He denies any dysuria, hematuria or urethral discharge. Denies any fever, chills, nausea, vomiting or genital area rash.  He has had 2 doses of COVID vaccine.  History reviewed. No pertinent past medical history.  History reviewed. No pertinent surgical history.  History reviewed. No pertinent family history.  Social History   Socioeconomic History   Marital status: Single    Spouse name: Not on file   Number of children: Not on file   Years of education: Not on file   Highest education level: Not on file  Occupational History   Not on file  Tobacco Use   Smoking status: Every Day    Packs/day: 0.50    Pack years: 0.00    Types: Cigarettes   Smokeless tobacco: Never  Vaping Use   Vaping Use: Never used  Substance and Sexual Activity   Alcohol use: No   Drug use: No   Sexual activity: Not on file  Other Topics Concern   Not on file  Social History Narrative   Not on file   Social Determinants of Health   Financial Resource Strain: Not on file  Food Insecurity: Not on file  Transportation Needs: Not on file  Physical Activity: Not on file  Stress: Not on file  Social Connections: Not on file  Intimate Partner Violence: Not on file    ROS Review of Systems  Constitutional:  Negative for chills and fever.  HENT:  Negative for congestion and sore throat.   Eyes:  Negative for pain and discharge.   Respiratory:  Negative for cough and shortness of breath.   Cardiovascular:  Negative for chest pain and palpitations.  Gastrointestinal:  Negative for constipation, diarrhea, nausea and vomiting.  Endocrine: Negative for polydipsia and polyuria.  Genitourinary:  Negative for dysuria and hematuria.  Musculoskeletal:  Negative for neck pain and neck stiffness.  Skin:  Negative for rash.  Neurological:  Negative for dizziness, weakness, numbness and headaches.  Psychiatric/Behavioral:  Negative for agitation and behavioral problems.    Objective:   Today's Vitals: BP 132/71 (BP Location: Left Arm, Patient Position: Sitting, Cuff Size: Normal)   Pulse (!) 54   Temp 98.4 F (36.9 C) (Oral)   Resp 18   Ht _0  (1.727 m)   Wt 205 lb 6.4 oz (93.2 kg)   SpO2 97%   BMI 31.23 kg/m   Physical Exam Vitals reviewed.  Constitutional:      General: He is not in acute distress.    Appearance: He is not diaphoretic.  HENT:     Head: Normocephalic and atraumatic.     Nose: Nose normal.     Mouth/Throat:     Mouth: Mucous membranes are moist.  Eyes:     General: No scleral icterus.    Extraocular Movements: Extraocular movements intact.  Cardiovascular:     Rate and Rhythm:  Normal rate and regular rhythm.     Pulses: Normal pulses.     Heart sounds: Normal heart sounds. No murmur heard. Pulmonary:     Breath sounds: Normal breath sounds. No wheezing or rales.  Musculoskeletal:     Cervical back: Neck supple. No tenderness.     Right lower leg: No edema.     Left lower leg: No edema.  Skin:    General: Skin is warm.     Findings: No rash.  Neurological:     General: No focal deficit present.     Mental Status: He is alert and oriented to person, place, and time.  Psychiatric:        Mood and Affect: Mood normal.        Behavior: Behavior normal.    Assessment & Plan:   Problem List Items Addressed This Visit       Other   Encounter to establish care - Primary    Care  established Previous chart reviewed History and medications reviewed with the patient       Relevant Orders   CBC with Differential/Platelet   CMP14+EGFR   Lipid panel   TSH   Vitamin D (25 hydroxy)   At risk for sexually transmitted disease due to partner with genital symptoms    Recent ER visit for STD screen Will empirically give Metronidazole 2 gm one time dose ER chart reviewed - negative Neisseria and Chlamydia Check STD screen and Hep C       Relevant Medications   metroNIDAZOLE (FLAGYL) 500 MG tablet   Tobacco use    Smokes about 0.5 cigarettes/day - not significant  Asked about quitting: confirms that he currently smokes cigarettes Advise to quit smoking: Educated about QUITTING to reduce the risk of cancer, cardio and cerebrovascular disease. Assess willingness: willing to quit at this time Assist with counseling and pharmacotherapy: Counseled for 5 minutes and literature provided. Arrange for follow up: follow up and continue to offer help.       Other Visit Diagnoses     Screen for STD (sexually transmitted disease)       Relevant Orders   STD Screen (6)   Need for hepatitis C screening test       Relevant Orders   Hepatitis C Antibody       Outpatient Encounter Medications as of 02/09/2021  Medication Sig   metroNIDAZOLE (FLAGYL) 500 MG tablet Take 4 tablets (2,000 mg total) by mouth once for 1 dose.   [DISCONTINUED] cyclobenzaprine (FLEXERIL) 5 MG tablet Take 1 tablet (5 mg total) by mouth 3 (three) times daily as needed for muscle spasms. (Patient not taking: Reported on 02/09/2021)   [DISCONTINUED] ibuprofen (ADVIL) 600 MG tablet Take 1 tablet (600 mg total) by mouth every 6 (six) hours as needed.   No facility-administered encounter medications on file as of 02/09/2021.    Follow-up: Return in about 6 months (around 08/11/2021) for Annual physical.   Lindell Spar, MD

## 2021-08-17 ENCOUNTER — Encounter: Payer: Self-pay | Admitting: Internal Medicine

## 2024-04-12 ENCOUNTER — Other Ambulatory Visit: Payer: Self-pay

## 2024-04-12 ENCOUNTER — Emergency Department (HOSPITAL_COMMUNITY)
Admission: EM | Admit: 2024-04-12 | Discharge: 2024-04-12 | Disposition: A | Discharge status: Home / Self Care | Payer: Self-pay | Attending: Emergency Medicine | Admitting: Emergency Medicine

## 2024-04-12 ENCOUNTER — Encounter (HOSPITAL_COMMUNITY): Payer: Self-pay | Admitting: Emergency Medicine

## 2024-04-12 DIAGNOSIS — T63441A Toxic effect of venom of bees, accidental (unintentional), initial encounter: Secondary | ICD-10-CM | POA: Insufficient documentation

## 2024-04-12 MED ORDER — FAMOTIDINE 20 MG PO TABS
20.0000 mg | ORAL_TABLET | Freq: Once | ORAL | Status: AC
  Administered 2024-04-12: 20 mg via ORAL
  Filled 2024-04-12: qty 1

## 2024-04-12 MED ORDER — PREDNISONE 50 MG PO TABS
60.0000 mg | ORAL_TABLET | Freq: Once | ORAL | Status: AC
  Administered 2024-04-12: 60 mg via ORAL
  Filled 2024-04-12: qty 1

## 2024-04-12 MED ORDER — NAPROXEN 250 MG PO TABS
500.0000 mg | ORAL_TABLET | Freq: Once | ORAL | Status: AC
  Administered 2024-04-12: 500 mg via ORAL
  Filled 2024-04-12: qty 2

## 2024-04-12 MED ORDER — PREDNISONE 10 MG PO TABS: 40.0000 mg | ORAL_TABLET | Freq: Every day | ORAL | 0 refills | Status: AC

## 2024-04-12 NOTE — Discharge Instructions (Signed)
 Please follow-up closely with your primary care doctor on an outpatient basis.  Return to emergency department immediately for any new or worsening symptoms.

## 2024-04-12 NOTE — ED Triage Notes (Signed)
 Pt to the ED with complaints of being stung by a japanese hornet.

## 2024-04-12 NOTE — ED Provider Notes (Signed)
 [facility] EMERGENCY DEPARTMENT AT [facility] Provider Note   CSN: [id number] Arrival date & time: 04/12/24  8087     Patient presents with: Insect Bite   Aaron Waller is a 43 y.o. male.   Patient is a 43 year old male who presents to the emergency department with a chief complaint of a bee sting to his right middle finger.  Patient notes that he has had no other systemic complaints other than pain to the finger as well as some pain going up the arm.  He denies any known allergy to bees.  Patient denies any associated chest pain, shortness of breath, abdominal pain, nausea, vomiting, diarrhea.        Prior to Admission medications   Medication Sig Start Date End Date Taking? Authorizing Provider  predniSONE  (DELTASONE ) 10 MG tablet Take 4 tablets (40 mg total) by mouth daily for 4 days. 04/12/24 04/16/24 Yes Daralene Lonni BIRCH, PA-C    Allergies: Tomato    Review of Systems  Skin:        Swelling and redness to right middle finger  All other systems reviewed and are negative.   Updated Vital Signs BP 139/88   Pulse 60   Temp 98 F (36.7 C)   Resp 17   Ht 5' 8 (1.727 m)   Wt 93 kg   SpO2 100%   BMI 31.17 kg/m   Physical Exam Vitals and nursing note reviewed.  Constitutional:      Appearance: Normal appearance.  HENT:     Head: Normocephalic and atraumatic.     Nose: Nose normal.     Mouth/Throat:     Mouth: Mucous membranes are moist.  Eyes:     Extraocular Movements: Extraocular movements intact.     Conjunctiva/sclera: Conjunctivae normal.     Pupils: Pupils are equal, round, and reactive to light.  Cardiovascular:     Rate and Rhythm: Normal rate and regular rhythm.     Pulses: Normal pulses.     Heart sounds: Normal heart sounds.  Pulmonary:     Effort: Pulmonary effort is normal. No respiratory distress.     Breath sounds: Normal breath sounds. No stridor. No wheezing, rhonchi or rales.  Musculoskeletal:        General: No  tenderness, deformity or signs of injury. Normal range of motion.  Skin:    General: Skin is warm and dry.     Comments: Erythema and edema noted to the distal aspect of the right middle finger, no areas of fluctuance or induration  Neurological:     General: No focal deficit present.     Mental Status: He is alert and oriented to person, place, and time. Mental status is at baseline.  Psychiatric:        Mood and Affect: Mood normal.        Behavior: Behavior normal.        Thought Content: Thought content normal.        Judgment: Judgment normal.     (all labs ordered are listed, but only abnormal results are displayed) Labs Reviewed - No data to display  EKG: None  Radiology: No results found.   Procedures   Medications Ordered in the ED  famotidine  (PEPCID ) tablet 20 mg (20 mg Oral Given 04/12/24 1946)  naproxen  (NAPROSYN ) tablet 500 mg (500 mg Oral Given 04/12/24 2050)  predniSONE  (DELTASONE ) tablet 60 mg (60 mg Oral Given 04/12/24 2050)  Medical Decision Making Patient is doing well at this time and is stable for discharge home.  Discussed with patient is suffering from a localized inflammatory reaction to this point.  Symptoms have improved with treatment in the emergency department.  He has had no indication for anaphylaxis.  Do not suspect any further workup or admission is warranted at this time.  Will continue symptomatic treatment on outpatient basis.  Close follow-up with PCP was discussed as well as strict turn precautions for any new or worsening symptoms.  Patient voiced understand to the plan and had no additional questions.  Risk Prescription drug management.        Final diagnoses:  Bee sting, accidental or unintentional, initial encounter    ED Discharge Orders          Ordered    predniSONE  (DELTASONE ) 10 MG tablet  Daily        04/12/24 2132               Daralene Lonni JONETTA DEVONNA 04/12/24  2149    Suzette Pac, MD 04/15/24 1049
